# Patient Record
Sex: Male | Born: 1980 | Race: White | Hispanic: No | Marital: Single | Smoking: Current every day smoker
Health system: Southern US, Community
[De-identification: ages and names within clinical notes are randomized; demographics above are authoritative.]

## PROBLEM LIST (undated history)

## (undated) DIAGNOSIS — T7840XA Allergy, unspecified, initial encounter: Secondary | ICD-10-CM

## (undated) HISTORY — DX: Allergy, unspecified, initial encounter: T78.40XA

## (undated) HISTORY — PX: CARPAL TUNNEL RELEASE: SHX101

## (undated) HISTORY — PX: BACK SURGERY: SHX140

## (undated) HISTORY — PX: TONSILLECTOMY AND ADENOIDECTOMY: SHX28

## (undated) HISTORY — PX: OTHER SURGICAL HISTORY: SHX169

## (undated) HISTORY — PX: TYMPANOSTOMY TUBE PLACEMENT: SHX32

---

## 2001-08-15 ENCOUNTER — Emergency Department (HOSPITAL_COMMUNITY): Admission: EM | Admit: 2001-08-15 | Discharge: 2001-08-16 | Payer: Self-pay | Admitting: *Deleted

## 2001-08-16 ENCOUNTER — Encounter: Payer: Self-pay | Admitting: Emergency Medicine

## 2005-10-16 ENCOUNTER — Ambulatory Visit: Payer: Self-pay | Admitting: Internal Medicine

## 2005-11-12 ENCOUNTER — Ambulatory Visit: Payer: Self-pay | Admitting: Internal Medicine

## 2007-05-23 ENCOUNTER — Emergency Department (HOSPITAL_COMMUNITY): Admission: EM | Admit: 2007-05-23 | Discharge: 2007-05-23 | Payer: Self-pay | Admitting: Emergency Medicine

## 2008-06-06 ENCOUNTER — Emergency Department (HOSPITAL_COMMUNITY): Admission: EM | Admit: 2008-06-06 | Discharge: 2008-06-06 | Payer: Self-pay | Admitting: Emergency Medicine

## 2008-07-19 ENCOUNTER — Ambulatory Visit (HOSPITAL_COMMUNITY): Admission: RE | Admit: 2008-07-19 | Discharge: 2008-07-20 | Payer: Self-pay | Admitting: Neurosurgery

## 2009-11-13 IMAGING — CR DG LUMBAR SPINE COMPLETE 4+V
5 series · 5 of 5 positions shown · non-contrast
Comparison: None

CLINICAL DATA: Back pain.  Injury lifting something

LUMBAR SPINE - COMPLETE 4+ VIEW

[t l-spine a.p.]
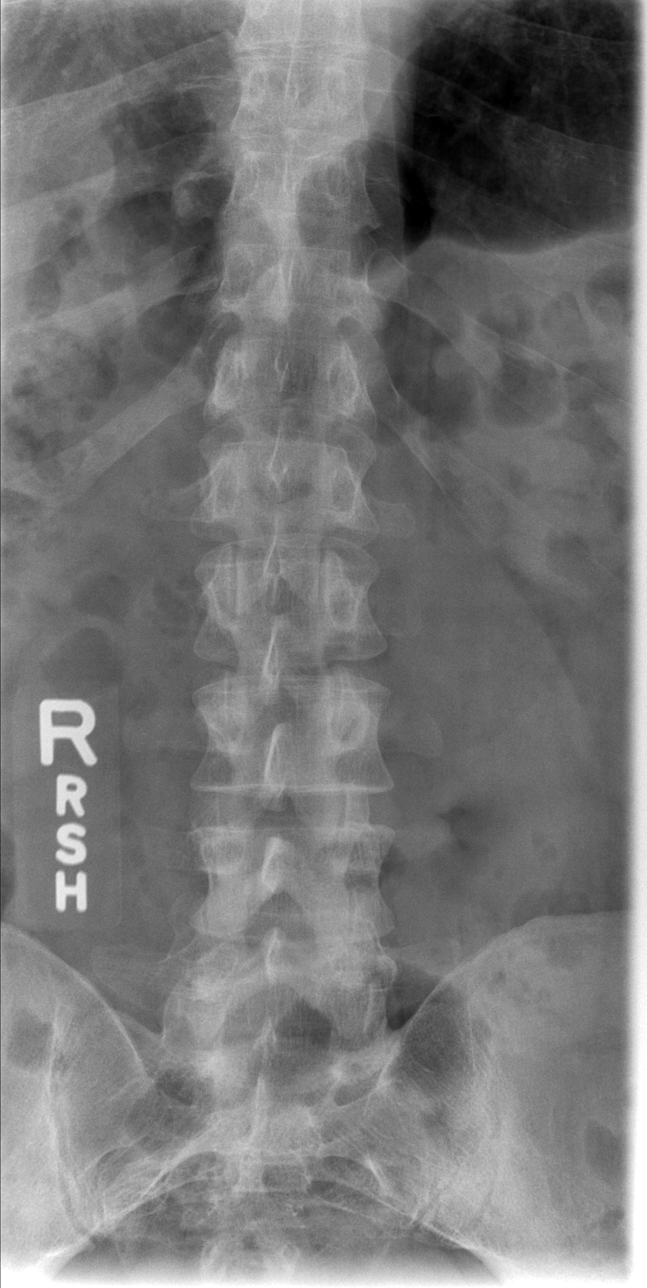

[t l-spine oblique exposure (1 of 2)]
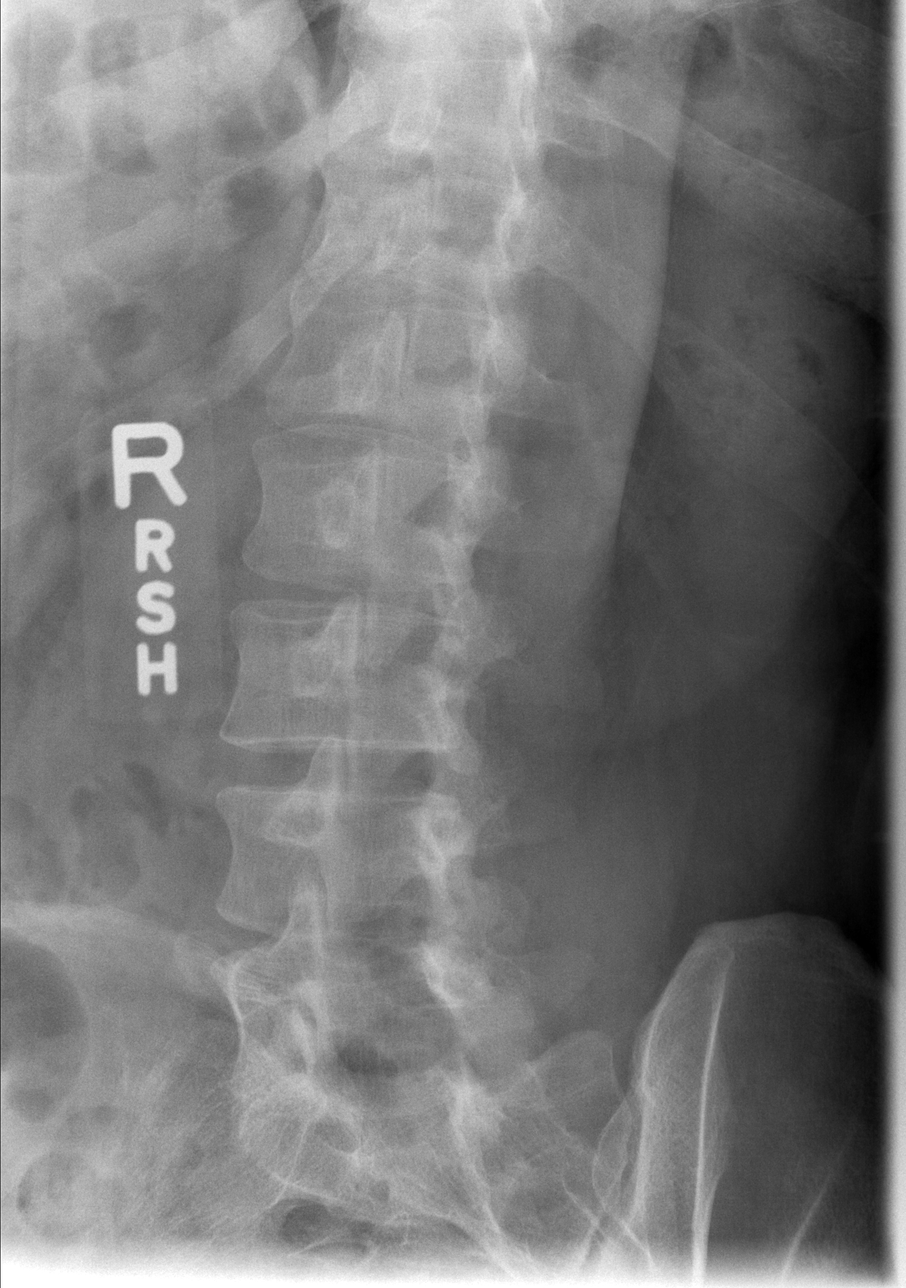

[t l-spine oblique exposure (2 of 2)]
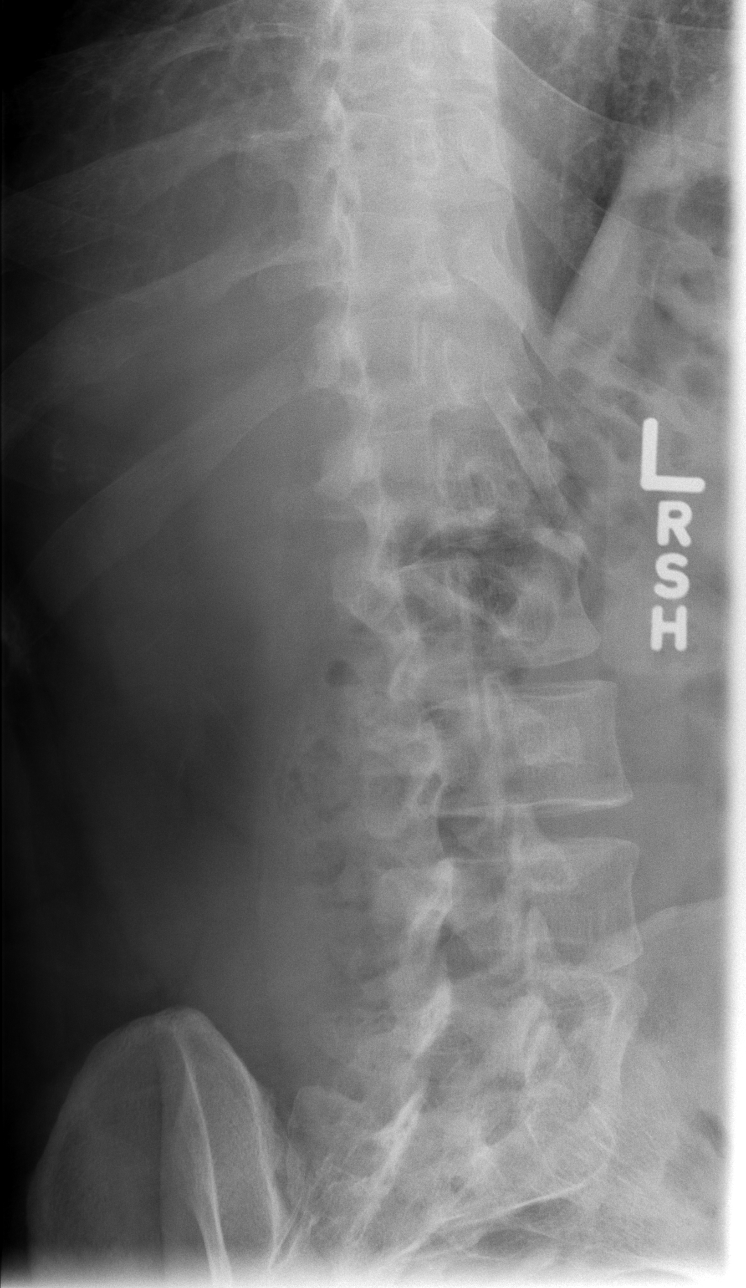

[t l-spine lat]
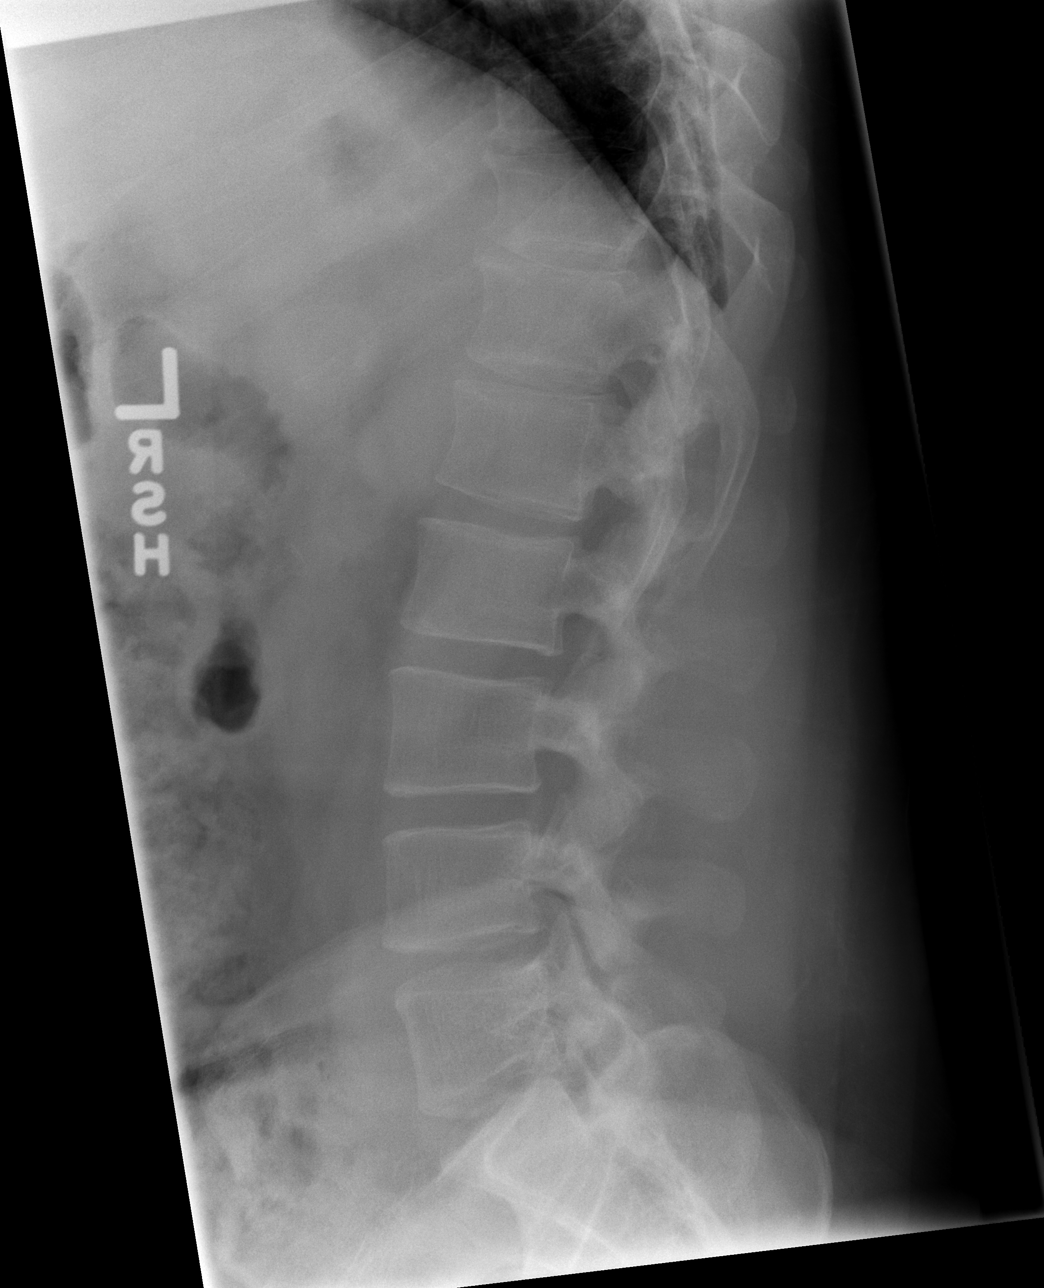

[t l-spine l5-s1 spot]
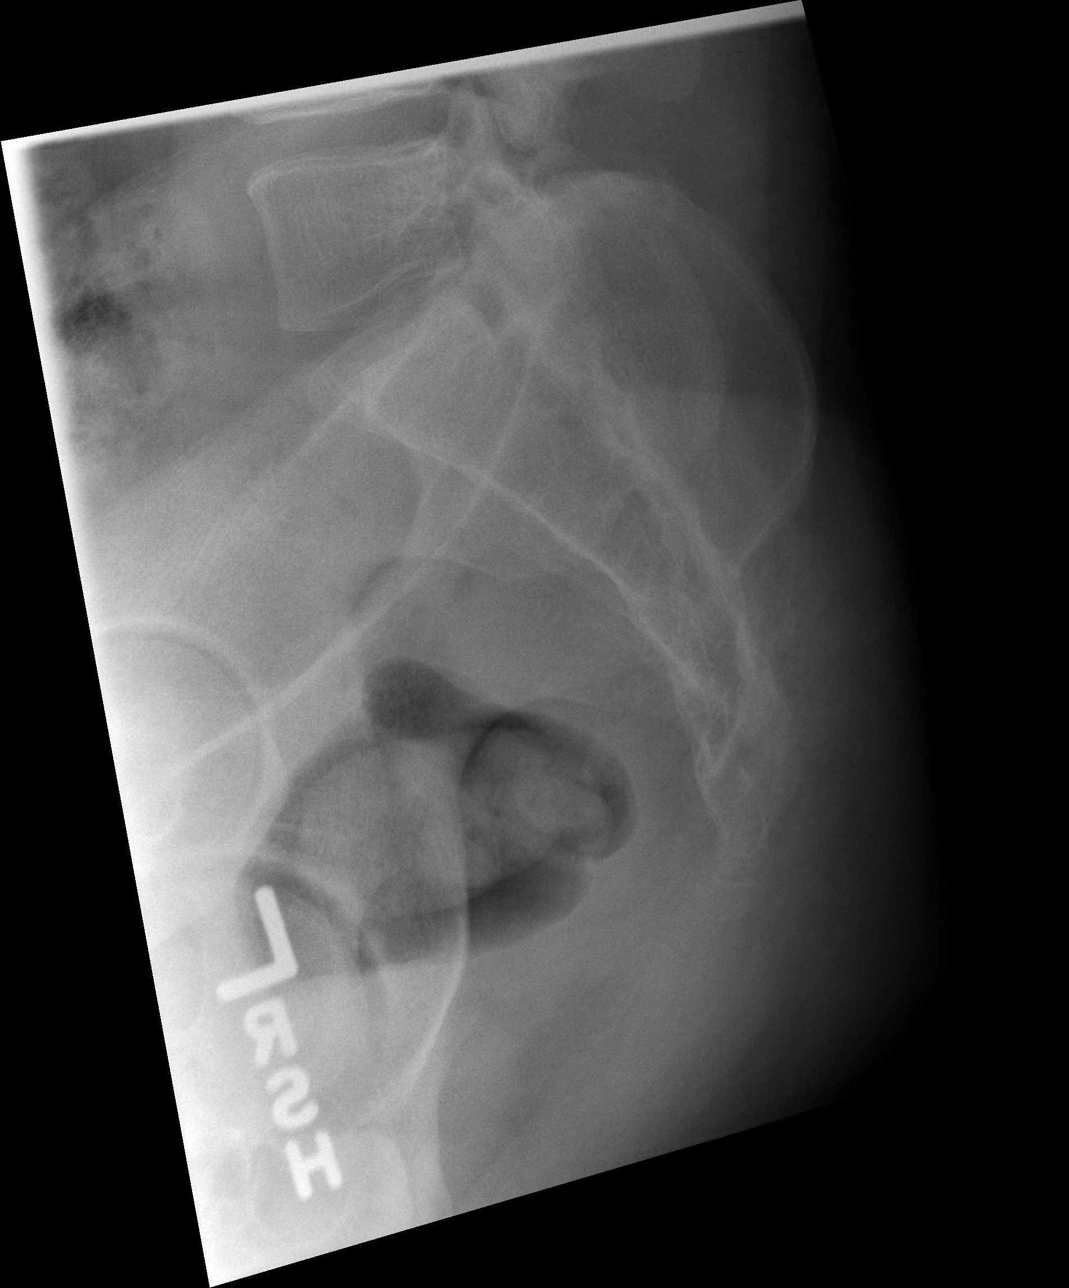

[5 of 5 positions shown; findings below may reference images not displayed]

FINDINGS: There is no evidence of lumbar spine fracture.  Alignment
is normal.  Intervertebral disc spaces are maintained.
IMPRESSION: Negative.

## 2011-03-24 NOTE — Op Note (Signed)
NAME:  YOUSAF, SAINATO NO.:  [id number]   MEDICAL RECORD NO.:  [id number]          PATIENT TYPE:  OIB   LOCATION:  3536                         FACILITY:  [facility]   PHYSICIAN:  Hewitt Shorts, M.D.DATE OF BIRTH:  Nov 16, 1980   DATE OF PROCEDURE:  07/19/2008  DATE OF DISCHARGE:                               OPERATIVE REPORT   PREOPERATIVE DIAGNOSES:  1. 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  2. Lumbar degenerative disk disease.  3. Lumbar spondylosis.  4. Lumbar radiculopathy   POSTOPERATIVE DIAGNOSES:  1. 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  2. Lumbar degenerative disk disease.  3. Lumbar spondylosis.  4. Lumbar radiculopathy   PROCEDURE:  Left L4-5 lumbar laminotomy and microdiskectomy with  microdissection.   SURGEON:  Hewitt Shorts, MD   ASSISTANT:  Webb Silversmith, NP, and Phoebe Perch.   ANESTHESIA:  General endotracheal.   INDICATIONS:  The patient is a 30 year old man who presented with low  back pain worse than left into the right with primarily left lumbar  discomfort found to have a large L4-5 disk herniation central to the  left, but extending slightly to the right and a decision was made to  proceed with a left L4-5 diskectomy.   PROCEDURE:  The patient was brought to the operating room and placed  under general endotracheal anesthesia.  The patient was turned to a  prone position.  The lumbar region was prepped with Betadine soap and  solution and draped in a sterile fashion.  The midline was infiltrated  with local anesthetic with epinephrine and the x-ray was taken.  The L4-  5 level was identified and a midline incision was made over the L4-5  level and carried down through the subcutaneous tissue.  Bipolar  electrocautery was used to maintain hemostasis.  Then, dissection was  carried down to the lumbar fascia, which was incised on the left side of  the midline.  The paraspinal muscles were dissected from the spinous  process and lamina in a  subperiosteal fashion.  Digital x-rays were  taken and the L4-5 intralaminar space was identified. The microscope was  draped and brought to the field to provide additional navigation,  illumination, and visualization.  The remainder of the decompression was  performed using microdissection and microsurgical technique.  Laminotomy  was performed using the X-Max drill and Kerrison punches.  The edges of  bone were waxed as needed. The ligamentum flavum was carefully removed  and we identified the thecal sac and exiting left L5 nerve root.  These  structures were retracted medially and the disk herniation identified.  Diskectomy was begun with incisionally remaining annular fibers and  continued with microcurettes and pituitary rongeurs.  We continue the  diskectomy entering into the disk space we performed the third  diskectomy removing all loose fragments of with disk material.  Particular attention was paid to the medial aspect of the disk  herniation to ensure that was removed as well and in the end good  decompression of the thecal sac and exiting left L5 nerve was achieved  and all loose fragment of the disk  material were removed from the disk  space and the epidural space.  Once the diskectomy was completed,  hemostasis was established with the use Gelfoam soaked in thrombin.  The  wound was irrigated with  Bacitracin solution.  The Gelfoam removed.  Hemostasis confirmed and then we instilled 2 mL of fentanyl and 80 mg of  Depo-Medrol into the epidural space and proceeded with closure.  The  deep fascia was closed with interrupted undyed #1 Vicryl sutures.  Scarpa fascia was closed with interrupted undyed #1 Vicryl sutures.  The  subcutaneous and subcuticular were closed with interrupted inverted 3-0  undyed Vicryl sutures.  The skin was approximated with Dermabond.  The  procedure was tolerated well.  Estimated blood loss was 25 mL.  Sponge  and needle count were correct.  Following  surgery, the patient was  returned back to the supine position to be reversed from the anesthetic,  extubated, and transferred to the recovery room for further care.      Hewitt Shorts, M.D.  Electronically Signed     RWN/MEDQ  D:  07/19/2008  T:  07/20/2008  Job:  [id number]

## 2011-08-12 LAB — CBC
HCT: 48.8
Hemoglobin: 16.7
MCHC: 34.2
MCV: 94.4
Platelets: 240
RBC: 5.17
RDW: 13.4
WBC: 11.3 — ABNORMAL HIGH

## 2011-08-12 LAB — BASIC METABOLIC PANEL
BUN: 7
CO2: 25
Calcium: 9.2
Chloride: 106
Creatinine, Ser: 0.78
GFR calc Af Amer: >60
GFR calc non Af Amer: >60
Glucose, Bld: 94
Potassium: 3.8
Sodium: 139

## 2012-01-25 ENCOUNTER — Ambulatory Visit (INDEPENDENT_AMBULATORY_CARE_PROVIDER_SITE_OTHER): Payer: BC Managed Care – PPO | Admitting: Family Medicine

## 2012-01-25 ENCOUNTER — Encounter: Payer: Self-pay | Admitting: Family Medicine

## 2012-01-25 DIAGNOSIS — J329 Chronic sinusitis, unspecified: Secondary | ICD-10-CM

## 2012-01-25 DIAGNOSIS — Z720 Tobacco use: Secondary | ICD-10-CM

## 2012-01-25 DIAGNOSIS — J019 Acute sinusitis, unspecified: Secondary | ICD-10-CM

## 2012-01-25 DIAGNOSIS — J309 Allergic rhinitis, unspecified: Secondary | ICD-10-CM

## 2012-01-25 DIAGNOSIS — F17200 Nicotine dependence, unspecified, uncomplicated: Secondary | ICD-10-CM

## 2012-01-25 DIAGNOSIS — Z13220 Encounter for screening for lipoid disorders: Secondary | ICD-10-CM

## 2012-01-25 DIAGNOSIS — J302 Other seasonal allergic rhinitis: Secondary | ICD-10-CM

## 2012-01-25 DIAGNOSIS — E669 Obesity, unspecified: Secondary | ICD-10-CM

## 2012-01-25 MED ORDER — AMOXICILLIN-POT CLAVULANATE 875-125 MG PO TABS: 1.0000 | ORAL_TABLET | Freq: Two times a day (BID) | ORAL | Status: AC

## 2012-01-25 NOTE — Progress Notes (Signed)
  Subjective:    Patient ID: Travis Velasquez, male    DOB: 06-Jun-1981, 31 y.o.   MRN: [id number]  HPI  Patient here to establish care. Previous PCP was Dr. Barbara Cower however he only saw him one time. He follows with [facility]  Medications and history reviewed  Sinusitis- patient is been dealing with sinus pressure headache and drainage for the past 3 weeks. He is taking all of his allergy medications however these have not helped. He is also try over-the-counter Alka-Seltzer for her sinus relief. He's never had a CT scan of his sinus region that he is aware of. He is being treated with inhalers to help his airways however does not know if he has diagnoses of bronchitis or not. He is a smoker.  Currently in school to complete IT degree     Review of Systems  GEN- denies fatigue, fever, weight loss,weakness, recent illness HEENT- denies eye drainage, change in vision, +nasal discharge, CVS- denies chest pain, palpitations RESP- denies SOB, cough, wheeze ABD- denies N/V, change in stools, abd pain GU- denies dysuria, hematuria, dribbling, incontinence MSK- denies joint pain, muscle aches, injury Neuro- + headache, dizziness, syncope, seizure activity       Objective:   Physical Exam  GEN- NAD, alert and oriented x3 HEENT- PERRL, EOMI, non injected sclera, pink conjunctiva, MMM, oropharynx clear, TM scarring bilat membranes, canals clear,no effusion seen, +maxillary tenderness, fundoscopic exam benign  Neck- Supple, no LAD CVS- RRR, no murmur RESP-CTAB ABD- NABS,soft, NT, ND EXT- No edema Pulses- Radial, DP- 2+        Assessment & Plan:   Will obtain FLP and baseline labs

## 2012-01-25 NOTE — Patient Instructions (Signed)
Take the antibiotics as prescribed Continue your other medications Get your labs done-fasting we will call with your results I recommend that you quit smoking! I will get your your records from your allergy specialist  F/U once a year

## 2012-01-27 DIAGNOSIS — J019 Acute sinusitis, unspecified: Secondary | ICD-10-CM | POA: Insufficient documentation

## 2012-01-27 DIAGNOSIS — E669 Obesity, unspecified: Secondary | ICD-10-CM | POA: Insufficient documentation

## 2012-01-27 DIAGNOSIS — J302 Other seasonal allergic rhinitis: Secondary | ICD-10-CM | POA: Insufficient documentation

## 2012-01-27 DIAGNOSIS — Z720 Tobacco use: Secondary | ICD-10-CM | POA: Insufficient documentation

## 2012-01-27 NOTE — Assessment & Plan Note (Signed)
Pt feels he is going to die from something whether he smokes or not, not interested in quitting

## 2012-01-27 NOTE — Assessment & Plan Note (Signed)
Obtain records from allergist, I am assuming based on meds he has an element of Asthma or bronchitis

## 2012-01-27 NOTE — Assessment & Plan Note (Signed)
Antibiotics given 

## 2012-02-03 ENCOUNTER — Other Ambulatory Visit: Payer: Self-pay | Admitting: Family Medicine

## 2012-02-04 ENCOUNTER — Ambulatory Visit (INDEPENDENT_AMBULATORY_CARE_PROVIDER_SITE_OTHER): Payer: BC Managed Care – PPO | Admitting: Family Medicine

## 2012-02-04 ENCOUNTER — Encounter: Payer: Self-pay | Admitting: Family Medicine

## 2012-02-04 VITALS — BP 112/76 | HR 87 | Temp 99.0°F | Resp 18 | Ht 70.5 in | Wt 267.0 lb

## 2012-02-04 DIAGNOSIS — E785 Hyperlipidemia, unspecified: Secondary | ICD-10-CM

## 2012-02-04 DIAGNOSIS — R739 Hyperglycemia, unspecified: Secondary | ICD-10-CM

## 2012-02-04 DIAGNOSIS — F17200 Nicotine dependence, unspecified, uncomplicated: Secondary | ICD-10-CM

## 2012-02-04 DIAGNOSIS — R7309 Other abnormal glucose: Secondary | ICD-10-CM

## 2012-02-04 DIAGNOSIS — J40 Bronchitis, not specified as acute or chronic: Secondary | ICD-10-CM

## 2012-02-04 DIAGNOSIS — Z720 Tobacco use: Secondary | ICD-10-CM

## 2012-02-04 LAB — LIPID PANEL
HDL: 20 mg/dL — ABNORMAL LOW (ref >39)
LDL Cholesterol: 114 mg/dL — ABNORMAL HIGH (ref 0–99)
Triglycerides: 135 mg/dL (ref <150)
VLDL: 27 mg/dL (ref 0–40)

## 2012-02-04 LAB — COMPREHENSIVE METABOLIC PANEL
ALT: 52 U/L (ref 0–53)
AST: 43 U/L — ABNORMAL HIGH (ref 0–37)
Albumin: 4.5 g/dL (ref 3.5–5.2)
Alkaline Phosphatase: 101 U/L (ref 39–117)
BUN: 8 mg/dL (ref 6–23)
Calcium: 9 mg/dL (ref 8.4–10.5)
Chloride: 104 mEq/L (ref 96–112)
Potassium: 3.8 mEq/L (ref 3.5–5.3)
Sodium: 139 mEq/L (ref 135–145)
Total Protein: 6.9 g/dL (ref 6.0–8.3)

## 2012-02-04 LAB — CBC
HCT: 50.3 % (ref 39.0–52.0)
MCHC: 34.2 g/dL (ref 30.0–36.0)
Platelets: 202 10*3/uL (ref 150–400)
RDW: 13.5 % (ref 11.5–15.5)
WBC: 8 10*3/uL (ref 4.0–10.5)

## 2012-02-04 MED ORDER — SODIUM CHLORIDE 0.9 % IV SOLN
125.0000 mg | Freq: Once | INTRAVENOUS | Status: AC
  Administered 2012-02-04: 130 mg via INTRAMUSCULAR

## 2012-02-04 MED ORDER — ALBUTEROL SULFATE (5 MG/ML) 0.5% IN NEBU
2.5000 mg | INHALATION_SOLUTION | Freq: Once | RESPIRATORY_TRACT | Status: AC
  Administered 2012-02-04: 2.5 mg via RESPIRATORY_TRACT

## 2012-02-04 MED ORDER — PREDNISONE 10 MG PO TABS: ORAL_TABLET | ORAL | Status: AC

## 2012-02-04 MED ORDER — AZITHROMYCIN 250 MG PO TABS: ORAL_TABLET | ORAL | Status: AC

## 2012-02-04 MED ORDER — METHYLPREDNISOLONE SODIUM SUCC 125 MG IJ SOLR: 125.0000 mg | Freq: Once | INTRAMUSCULAR | Status: DC

## 2012-02-04 NOTE — Assessment & Plan Note (Signed)
Pt with second illness, now clearly has a bronchitis with wheeze, given IM Solumedrol, start prednisone burst and change to Azithromycin. Continue albuterol.He has had PFT per report

## 2012-02-04 NOTE — Assessment & Plan Note (Signed)
Will obtain A1C.   

## 2012-02-04 NOTE — Progress Notes (Signed)
  Subjective:    Patient ID: Travis Velasquez, male    DOB: 1980/12/18, 31 y.o.   MRN: [id number]  HPI Patient seen a week and a half ago secondary to sinusitis started on Augmentin. He states he has one more day of medication left however has been getting worse. He is now coughing with a large amount of production as well as wheezing. He also admits to subjective fever and feeling short of breath at times. He has been using Symbicort and uses albuterol for all more than normal. He still denies any history of bronchitis or asthma. I have not received any records.    Review of Systems  GEN- +fatigue,+ fever, weight loss,weakness, recent illness HEENT- denies eye drainage, change in vision, +nasal discharge,+nasal congestion CVS- denies chest pain, palpitations RESP- +SOB, +cough, +wheeze ABD- denies N/V, change in stools, abd pain GU- denies dysuria, hematuria, dribbling, incontinence MSK- denies joint pain, muscle aches, injury Neuro- + headache, dizziness, syncope, seizure activity      Objective:   Physical Exam GEN- NAD, alert and oriented x3,flushed appearing HEENT- PERRL, EOMI, non injected sclera, pink conjunctiva, MMM, oropharynx clear, TM scarring bilat membranes, canals clear,no effusion seen, +maxillary tenderness, Neck- Supple, no LAD CVS- RRR, no murmur RESP-expiratory wheeze bilat, normal WOB, fair air movement, no retractions Pulses- Radial, DP- 2+   S/p neb- good air movement, minimal wheeze       Assessment & Plan:

## 2012-02-04 NOTE — Assessment & Plan Note (Signed)
Discussed importance of tobacco cessation, I think after this illness he may take it more seriously

## 2012-02-04 NOTE — Patient Instructions (Addendum)
You need to quit smoking! Start the steroid pills tomorrow New antibiotic sent If you are not better by Monday please call- you may need an x-ray  Acute Bronchitis You have acute bronchitis. This means you have a chest cold. The airways in your lungs are red and sore (inflamed). Acute means it is sudden onset.   CAUSES Bronchitis is most often caused by the same virus that causes a cold. SYMPTOMS    Body aches.   Chest congestion.   Chills.   Cough.   Fever.   Shortness of breath.   Sore throat.  TREATMENT   Acute bronchitis is usually treated with rest, fluids, and medicines for relief of fever or cough. Most symptoms should go away after a few days or a week. Increased fluids may help thin your secretions and will prevent dehydration. Your caregiver may give you an inhaler to improve your symptoms. The inhaler reduces shortness of breath and helps control cough. You can take over-the-counter pain relievers or cough medicine to decrease coughing, pain, or fever. A cool-air vaporizer may help thin bronchial secretions and make it easier to clear your chest. Antibiotics are usually not needed but can be prescribed if you smoke, are seriously ill, have chronic lung problems, are elderly, or you are at higher risk for developing complications. Allergies and asthma can make bronchitis worse. Repeated episodes of bronchitis may cause longstanding lung problems. Avoid smoking and secondhand smoke. Exposure to cigarette smoke or irritating chemicals will make bronchitis worse. If you are a cigarette smoker, consider using nicotine gum or skin patches to help control withdrawal symptoms. Quitting smoking will help your lungs heal faster. Recovery from bronchitis is often slow, but you should start feeling better after 2 to 3 days. Cough from bronchitis frequently lasts for 3 to 4 weeks. To prevent another bout of acute bronchitis:  Quit smoking.   Wash your hands frequently to get rid of  viruses or use a hand sanitizer.   Avoid other people with cold or virus symptoms.   Try not to touch your hands to your mouth, nose, or eyes.  SEEK IMMEDIATE MEDICAL CARE IF:  You develop increased fever, chills, or chest pain.   You have severe shortness of breath or bloody sputum.   You develop dehydration, fainting, repeated vomiting, or a severe headache.   You have no improvement after 1 week of treatment or you get worse.  MAKE SURE YOU:    Understand these instructions.   Will watch your condition.   Will get help right away if you are not doing well or get worse.  Document Released: 12/03/2004 Document Revised: 10/15/2011 Document Reviewed: 02/18/2011 [facility] Patient Information 2012 [place], [place].  I recommend 30 minutes of exercise 5 days a week Watch the fried foods, fatty foods

## 2012-02-04 NOTE — Assessment & Plan Note (Signed)
No meds needed, weight loss encouraged

## 2012-02-09 ENCOUNTER — Telehealth: Payer: Self-pay | Admitting: Family Medicine

## 2012-02-09 ENCOUNTER — Encounter: Payer: Self-pay | Admitting: Family Medicine

## 2012-02-09 NOTE — Telephone Encounter (Signed)
I spoke with pt, records indicate chronic bronchitis, he continues to smoke, he had normal spirometry in 2009, will set up for PFT, as I am concerned his status has worsened

## 2012-02-10 NOTE — Telephone Encounter (Signed)
Pt has appt at [facility] for pft on 02/16/2012 2:30. No antihistmine. Left message for pt

## 2012-02-16 ENCOUNTER — Encounter (HOSPITAL_COMMUNITY): Payer: BC Managed Care – PPO

## 2012-02-19 ENCOUNTER — Ambulatory Visit (HOSPITAL_COMMUNITY)
Admission: RE | Admit: 2012-02-19 | Discharge: 2012-02-19 | Disposition: A | Discharge status: Home / Self Care | Payer: BC Managed Care – PPO | Source: Ambulatory Visit | Attending: Family Medicine | Admitting: Family Medicine

## 2012-02-19 DIAGNOSIS — R0609 Other forms of dyspnea: Secondary | ICD-10-CM | POA: Insufficient documentation

## 2012-02-19 DIAGNOSIS — R0989 Other specified symptoms and signs involving the circulatory and respiratory systems: Secondary | ICD-10-CM | POA: Insufficient documentation

## 2012-02-19 DIAGNOSIS — J42 Unspecified chronic bronchitis: Secondary | ICD-10-CM | POA: Insufficient documentation

## 2012-02-23 NOTE — Procedures (Signed)
NAME:  ANDREZ, LIEURANCE               ACCOUNT NO.:  [id number]  MEDICAL RECORD NO.:  [id number]  LOCATION:  RESP                          FACILITY:  [facility]  PHYSICIAN:  Clorene Nerio L. Juanetta Gosling, M.D.DATE OF BIRTH:  1980-12-17  DATE OF PROCEDURE: DATE OF DISCHARGE:  02/19/2012                           PULMONARY FUNCTION TEST   Pulmonary function testing is done for chronic bronchitis. 1. Spirometry shows no ventilatory defect and no evidence of airflow     obstruction. 2. Lung volumes are normal. 3. DLCO is normal. 4. Airway resistance is elevated suggesting airflow obstruction and     may respond to inhaled bronchodilators.     Lyndzee Kliebert L. Juanetta Gosling, M.D.     ELH/MEDQ  D:  02/23/2012  T:  02/23/2012  Job:  [id number]  cc:   Milinda Antis, MD

## 2012-02-26 LAB — PULMONARY FUNCTION TEST

## 2012-03-01 ENCOUNTER — Telehealth: Payer: Self-pay | Admitting: Family Medicine

## 2012-03-03 NOTE — Telephone Encounter (Signed)
Please advise on what to tell pt regarding PFT?

## 2012-03-03 NOTE — Telephone Encounter (Signed)
Patient aware.

## 2012-03-03 NOTE — Telephone Encounter (Signed)
He does have chronic bronchitis, but this is improved with is inhalers so he should use on regular basis as prescribed He needs to quit smoking or this will worsen

## 2012-07-12 ENCOUNTER — Telehealth: Payer: Self-pay | Admitting: Family Medicine

## 2012-07-14 NOTE — Telephone Encounter (Signed)
Advise OTC sudafed one daily x 5 days, ocean spray/saline washes as directed, also oTC zyrtec/claritin one daily, call back next week if fever , chills etc to see his PCP

## 2012-07-14 NOTE — Telephone Encounter (Signed)
Nose stopped up constantly running, clear, congested in chest and able to produce clear phlegm x 1 week. No appts. Wants something sent in to [facility]. Tried alker seltzer and mucinex OTC

## 2012-07-15 NOTE — Telephone Encounter (Signed)
Message left for patient on personal voicemail  

## 2012-10-03 ENCOUNTER — Telehealth: Payer: Self-pay | Admitting: Family Medicine

## 2012-10-03 NOTE — Telephone Encounter (Signed)
Pt called Sat AM with acute face/jaw swelling and tooth pain, dentist advised him to call me, directed to nearest Urgent care with facial swelling, possible abscess tooth He voiced understanding

## 2022-04-10 ENCOUNTER — Emergency Department (HOSPITAL_COMMUNITY)
Admission: EM | Admit: 2022-04-10 | Discharge: 2022-04-10 | Disposition: A | Discharge status: Home / Self Care | Payer: Self-pay | Attending: Emergency Medicine | Admitting: Emergency Medicine

## 2022-04-10 ENCOUNTER — Other Ambulatory Visit: Payer: Self-pay

## 2022-04-10 ENCOUNTER — Encounter (HOSPITAL_COMMUNITY): Payer: Self-pay

## 2022-04-10 DIAGNOSIS — R739 Hyperglycemia, unspecified: Secondary | ICD-10-CM | POA: Insufficient documentation

## 2022-04-10 DIAGNOSIS — R112 Nausea with vomiting, unspecified: Secondary | ICD-10-CM | POA: Insufficient documentation

## 2022-04-10 LAB — COMPREHENSIVE METABOLIC PANEL
ALT: 52 U/L — ABNORMAL HIGH (ref 0–44)
AST: 38 U/L (ref 15–41)
Albumin: 4.6 g/dL (ref 3.5–5.0)
Alkaline Phosphatase: 74 U/L (ref 38–126)
Anion gap: 5 (ref 5–15)
BUN: 12 mg/dL (ref 6–20)
CO2: 25 mmol/L (ref 22–32)
Calcium: 9.3 mg/dL (ref 8.9–10.3)
Chloride: 105 mmol/L (ref 98–111)
Creatinine, Ser: 0.81 mg/dL (ref 0.61–1.24)
GFR, Estimated: >60 mL/min (ref >60)
Glucose, Bld: 187 mg/dL — ABNORMAL HIGH (ref 70–99)
Potassium: 3.9 mmol/L (ref 3.5–5.1)
Sodium: 135 mmol/L (ref 135–145)
Total Bilirubin: 1 mg/dL (ref 0.3–1.2)
Total Protein: 7.4 g/dL (ref 6.5–8.1)

## 2022-04-10 LAB — LIPASE, BLOOD: Lipase: 33 U/L (ref 11–51)

## 2022-04-10 LAB — CBC WITH DIFFERENTIAL/PLATELET
Abs Immature Granulocytes: 0.03 10*3/uL (ref 0.00–0.07)
Basophils Absolute: 0.1 10*3/uL (ref 0.0–0.1)
Basophils Relative: 1 %
Eosinophils Absolute: 0.1 10*3/uL (ref 0.0–0.5)
Eosinophils Relative: 1 %
HCT: 44.5 % (ref 39.0–52.0)
Hemoglobin: 15.9 g/dL (ref 13.0–17.0)
Immature Granulocytes: 0 %
Lymphocytes Relative: 20 %
Lymphs Abs: 1.9 10*3/uL (ref 0.7–4.0)
MCH: 31.9 pg (ref 26.0–34.0)
MCHC: 35.7 g/dL (ref 30.0–36.0)
MCV: 89.2 fL (ref 80.0–100.0)
Monocytes Absolute: 0.5 10*3/uL (ref 0.1–1.0)
Monocytes Relative: 5 %
Neutro Abs: 6.7 10*3/uL (ref 1.7–7.7)
Neutrophils Relative %: 73 %
Platelets: 224 10*3/uL (ref 150–400)
RBC: 4.99 MIL/uL (ref 4.22–5.81)
RDW: 12.9 % (ref 11.5–15.5)
WBC: 9.2 10*3/uL (ref 4.0–10.5)
nRBC: 0 % (ref 0.0–0.2)

## 2022-04-10 LAB — CBG MONITORING, ED: Glucose-Capillary: 155 mg/dL — ABNORMAL HIGH (ref 70–99)

## 2022-04-10 MED ORDER — SODIUM CHLORIDE 0.9 % IV BOLUS
1000.0000 mL | Freq: Once | INTRAVENOUS | Status: AC
  Administered 2022-04-10: 1000 mL via INTRAVENOUS

## 2022-04-10 NOTE — ED Triage Notes (Signed)
Patient states that he stopped to pick up something and he fell in a hole in the yard.  Denies loss of consciousness or hitting head.  States that after the fall that's when he felt nauseated and the headache started.

## 2022-04-10 NOTE — Discharge Instructions (Addendum)
Like we discussed, your symptoms appear consistent with a possible heat exhaustion.  Please make sure that while working outdoors you are staying adequately hydrated and taking breaks.  I recommend at least 64 ounces of water per day.  Your blood sugar was also elevated today.  Please follow-up with your primary care provider in the next 1 to 2 weeks to have this rechecked.  If you develop any new or worsening symptoms whatsoever please come back to the emergency department.

## 2022-04-10 NOTE — ED Triage Notes (Signed)
Patient brought on in by [facility] after mowing his yard, becoming nauseated, vomiting twice, and had a sudden onset of a severe headache.

## 2022-04-10 NOTE — ED Provider Notes (Signed)
[facility] EMERGENCY DEPARTMENT Provider Note   CSN: [id number] Arrival date & time: 04/10/22  1359     History  Chief Complaint  Patient presents with   Headache   Nausea    Travis Velasquez is a 41 y.o. male.  HPI Patient is a 41 year old male with a history of hyperlipidemia, tobacco use, obesity, who presents to the emergency department due to nausea and vomiting.  Patient states he was mowing his yard.  While in the yard he went to pick up something and briefly tripped and went down to his knee in a hole in the yard.  Denies any falls, head trauma, LOC, anticoagulation.  Afterwards he began mowing once again and became extremely thirsty so he drank a glass of water and resumed mowing.  He then became thirsty once again and drank more water and resumed mowing.  After this occurred he states that he felt a "knot in his stomach" and had an episode of watery vomit.  Afterwards he began developing an intermittent throbbing headache which has since resolved.  Denies any visual changes, numbness, weakness, chest pain, shortness of breath, abdominal pain, current nausea.    Home Medications Prior to Admission medications   Medication Sig Start Date End Date Taking? Authorizing Provider  albuterol (PROVENTIL HFA;VENTOLIN HFA) 108 (90 BASE) MCG/ACT inhaler Inhale 2 puffs into the lungs every 4 (four) hours as needed.    [provider]  Azelastine-Fluticasone (DYMISTA) 137-50 MCG/ACT SUSP Place 2 sprays into the nose daily.    [provider]  budesonide-formoterol (SYMBICORT) 80-4.5 MCG/ACT inhaler Inhale 2 puffs into the lungs 2 (two) times daily.    [provider]  fexofenadine (ALLEGRA) 180 MG tablet Take 180 mg by mouth daily.    [provider]  predniSONE (DELTASONE) 10 MG tablet Take 40mg  by mouth daily for 5 days 02/04/12   02/06/12, MD      Allergies    Codeine    Review of Systems   Review of Systems  All other systems reviewed  and are negative. Ten systems reviewed and are negative for acute change, except as noted in the HPI.   Physical Exam Updated Vital Signs BP 106/67   Pulse 67   Temp 98 F (36.7 C) (Oral)   Resp (!) 22   Ht 5\' 10"  (1.778 m)   Wt 120.2 kg   SpO2 97%   BMI 38.02 kg/m  Physical Exam Vitals and nursing note reviewed.  Constitutional:      General: He is not in acute distress.    Appearance: Normal appearance. He is well-developed. He is not ill-appearing, toxic-appearing or diaphoretic.  HENT:     Head: Normocephalic and atraumatic.     Right Ear: External ear normal.     Left Ear: External ear normal.     Nose: Nose normal.     Mouth/Throat:     Mouth: Mucous membranes are moist.     Pharynx: Oropharynx is clear. No oropharyngeal exudate or posterior oropharyngeal erythema.  Eyes:     Extraocular Movements: Extraocular movements intact.  Cardiovascular:     Rate and Rhythm: Normal rate and regular rhythm.     Pulses: Normal pulses.     Heart sounds: Normal heart sounds. No murmur heard.   No friction rub. No gallop.  Pulmonary:     Effort: Pulmonary effort is normal. No respiratory distress.     Breath sounds: Normal breath sounds. No stridor. No wheezing, rhonchi  or rales.  Abdominal:     General: Abdomen is flat.     Palpations: Abdomen is soft.     Tenderness: There is no abdominal tenderness.     Comments: Abdomen is soft and nontender in all 4 quadrants.  Musculoskeletal:        General: Normal range of motion.     Cervical back: Normal range of motion and neck supple. No tenderness.  Skin:    General: Skin is warm and dry.  Neurological:     General: No focal deficit present.     Mental Status: He is alert and oriented to person, place, and time.     GCS: GCS eye subscore is 4. GCS verbal subscore is 5. GCS motor subscore is 6.     Comments: A&O x3.  Speaking clearly and coherently.  Moving all 4 extremities with ease.  No gross deficits.  Psychiatric:         Mood and Affect: Mood normal.        Behavior: Behavior normal.   ED Results / Procedures / Treatments   Labs (all labs ordered are listed, but only abnormal results are displayed) Labs Reviewed  COMPREHENSIVE METABOLIC PANEL - Abnormal; Notable for the following components:      Result Value   Glucose, Bld 187 (*)    ALT 52 (*)    All other components within normal limits  CBG MONITORING, ED - Abnormal; Notable for the following components:   Glucose-Capillary 155 (*)    All other components within normal limits  CBC WITH DIFFERENTIAL/PLATELET  LIPASE, BLOOD   EKG None  Radiology No results found.  Procedures Procedures   Medications Ordered in ED Medications  sodium chloride 0.9 % bolus 1,000 mL (1,000 mLs Intravenous New Bag/Given 04/10/22 1458)   ED Course/ Medical Decision Making/ A&P                           Medical Decision Making Amount and/or Complexity of Data Reviewed Labs: ordered.   Pt is a 41 y.o. male who presents to the emergency department due to an episode of nausea and vomiting that occurred earlier today while working in the yard.  Please see HPI above for additional information.  Labs: CBC without abnormalities. CMP with a glucose of 187 and an ALT of 52. CBG of 155. Lipase of 33.  I, Placido Sou, PA-C, personally reviewed and evaluated these images and lab results as part of my medical decision-making.  On my exam heart is regular rate and rhythm without murmurs, rubs, or gallops.  Lungs are clear to auscultation bilaterally.  Abdomen is soft and nontender in all 4 quadrants.  Patient A&O x3.  No gross deficits.  Lab work with abnormalities as noted above.  Appears generally reassuring.  Patient does note a history of hyperglycemia but states that he has never been started on medications in the past.  Recommended that he follow-up with his PCP regarding this for recheck in the next 1 to 2 weeks.  He verbalized understanding.  Patient's  symptoms appear consistent with likely heat exhaustion.  States he was working outside when his symptoms occurred.  Since coming to the emergency department his symptoms appear to have resolved.  He was given a liter of IV fluids and reassessed and notes moderate improvement.  He is ambulating without difficulty.  No further nausea and vomiting.  Patient appears stable for discharge at this time and  he is agreeable.  Discussed return precautions in length.  Discussed adequate hydration.  His questions were answered and he was amicable at the time of discharge.  Note: Portions of this report may have been transcribed using voice recognition software. Every effort was made to ensure accuracy; however, inadvertent computerized transcription errors may be present.   Final Clinical Impression(s) / ED Diagnoses Final diagnoses:  Non-intractable vomiting with nausea  Hyperglycemia   Rx / DC Orders ED Discharge Orders     None         Placido SouJoldersma, Fabyan Loughmiller, PA-C 04/10/22 Orpah [id number]    Gold[place], Scott, MD 04/12/22 1356
